# Patient Record
Sex: Male | Born: 1985 | Hispanic: Yes | Marital: Married | Smoking: Never smoker
Health system: Southern US, Community
[De-identification: ages and names within clinical notes are randomized; demographics above are authoritative.]

## PROBLEM LIST (undated history)

## (undated) DIAGNOSIS — K760 Fatty (change of) liver, not elsewhere classified: Secondary | ICD-10-CM

---

## 2017-10-09 ENCOUNTER — Encounter (HOSPITAL_COMMUNITY): Payer: Self-pay

## 2017-10-09 ENCOUNTER — Emergency Department (HOSPITAL_COMMUNITY)
Admission: EM | Admit: 2017-10-09 | Discharge: 2017-10-09 | Disposition: A | Discharge status: Home / Self Care | Payer: Self-pay | Attending: Emergency Medicine | Admitting: Emergency Medicine

## 2017-10-09 DIAGNOSIS — Z202 Contact with and (suspected) exposure to infections with a predominantly sexual mode of transmission: Secondary | ICD-10-CM | POA: Insufficient documentation

## 2017-10-09 LAB — URINALYSIS, ROUTINE W REFLEX MICROSCOPIC
Bilirubin Urine: NEGATIVE
Glucose, UA: NEGATIVE mg/dL
Hgb urine dipstick: NEGATIVE
Ketones, ur: NEGATIVE mg/dL
LEUKOCYTES UA: NEGATIVE
NITRITE: NEGATIVE
PROTEIN: NEGATIVE mg/dL
Specific Gravity, Urine: 1.021 (ref 1.005–1.030)
pH: 6 (ref 5.0–8.0)

## 2017-10-09 MED ORDER — AZITHROMYCIN 250 MG PO TABS
1000.0000 mg | ORAL_TABLET | Freq: Once | ORAL | Status: AC
  Administered 2017-10-09: 1000 mg via ORAL
  Filled 2017-10-09: qty 4

## 2017-10-09 MED ORDER — CEFTRIAXONE SODIUM 250 MG IJ SOLR
250.0000 mg | Freq: Once | INTRAMUSCULAR | Status: AC
  Administered 2017-10-09: 250 mg via INTRAMUSCULAR
  Filled 2017-10-09: qty 250

## 2017-10-09 MED ORDER — LIDOCAINE HCL (PF) 1 % IJ SOLN
INTRAMUSCULAR | Status: AC
  Filled 2017-10-09: qty 5

## 2017-10-09 NOTE — ED Provider Notes (Signed)
[facility] EMERGENCY DEPARTMENT Provider Note   CSN: [id number] Arrival date & time: 10/09/17  1116     History   Chief Complaint Chief Complaint  Patient presents with  . Exposure to STD    HPI Christ Fullenwider is a 31 y.o. male.  HPI   Patient is a 31 y.o. male with no significant past medical history presenting for possible STI exposure. Patient reports he received information that a former sexual partner had been diagnosed with an STI. Patient was unsure what STI his partner was diagnosed with. Patient is asymptomatic of abdominal pain, penile pain, testicular pain, dysuria, penile discharge. Patient reports 2 male partners in the last month. Patient is requesting testing and treatment for STI today.   History reviewed. No pertinent past medical history.  There are no active problems to display for this patient.   History reviewed. No pertinent surgical history.     Home Medications    Prior to Admission medications   Not on File    Family History No family history on file.  Social History Social History  Substance Use Topics  . Smoking status: Never Smoker  . Smokeless tobacco: Never Used  . Alcohol use No     Allergies   Patient has no allergy information on record.   Review of Systems Review of Systems  Gastrointestinal: Negative for abdominal pain.  Genitourinary: Negative for difficulty urinating, discharge, dysuria, penile pain, penile swelling, scrotal swelling and testicular pain.  Skin: Negative for rash.     Physical Exam Updated Vital Signs BP (!) 116/51 (BP Location: Right Arm)   Pulse 75   Temp 98.4 F (36.9 C) (Oral)   Resp 18   Ht 5\' 8"  (1.727 m)   SpO2 99%   Physical Exam  Constitutional: He appears well-developed and well-nourished. No distress.  Sitting comfortably in bed.  HENT:  Head: Normocephalic and atraumatic.  Eyes: Conjunctivae are normal. Right eye exhibits no discharge. Left eye exhibits  no discharge.  EOMs normal to gross examination.  Neck: Normal range of motion.  Pulmonary/Chest:  Normal respiratory effort. Patient converses comfortably. No audible wheeze or stridor.  Abdominal: He exhibits no distension.  Genitourinary:  Genitourinary Comments: Exam performed with nurse chaperone present. Uncircumcised male. No inguinal lymphadenopathy. No lesions of the penis or testicle. No pain to palpation of penis or testicles. No active penile discharge.  Musculoskeletal: Normal range of motion.  Neurological: He is alert.  Cranial nerves intact to gross observation. Patient moves extremities without difficulty.  Skin: Skin is warm and dry. He is not diaphoretic.  Psychiatric: He has a normal mood and affect. His behavior is normal. Judgment and thought content normal.  Nursing note and vitals reviewed.    ED Treatments / Results  Labs (all labs ordered are listed, but only abnormal results are displayed) Labs Reviewed  URINALYSIS, ROUTINE W REFLEX MICROSCOPIC  HIV ANTIBODY (ROUTINE TESTING)  RPR  GC/CHLAMYDIA PROBE AMP ([place]) NOT AT [facility]    EKG  EKG Interpretation None       Radiology No results found.  Procedures Procedures (including critical care time)  Medications Ordered in ED Medications  cefTRIAXone (ROCEPHIN) injection 250 mg (250 mg Intramuscular Given 10/09/17 1424)  azithromycin (ZITHROMAX) tablet 1,000 mg (1,000 mg Oral Given 10/09/17 1422)     Initial Impression / Assessment and Plan / ED Course  I have reviewed the triage vital signs and the nursing notes.  Pertinent labs & imaging results that  were available during my care of the patient were reviewed by me and considered in my medical decision making (see chart for details).     Final Clinical Impressions(s) / ED Diagnoses   Final diagnoses:  STD exposure   Patient is a 31 y.o. male who presents to ED for concerns for STI exposure. He is afebrile without abdominal  tenderness, testicular pain, or rectal pain.  No tenderness to palpation of the testes or epididymis to suggest orchitis or epididymitis.  STD cultures obtained including HIV, syphilis, gonorrhea and chlamydia for screening. Patient to be discharged with instructions to follow up with health department. Encouraged condom use. Patient understands that they have GC/Chlamydia cultures pending and that they will need to inform all sexual partners if results return positive. Engaged in shared decision-making with patient regarding prophylactic treatment, and possibly delaying treatment until GC/CT tests result. Patient wished to be treated today. Patient has been treated prophylactically with azithromycin and Rocephin. Return precautions given and all questions answered.   New Prescriptions There are no discharge medications for this patient.    Elisha PonderMurray, Kensington Rios B, PA-C 10/09/17 2024    Pricilla LovelessGoldston, Scott, MD 10/10/17 [phone]

## 2017-10-09 NOTE — Discharge Instructions (Signed)
Medications  1. Azithromycin  2. Ceftriaxone  Use a condom with every sexual encounter Follow up with your doctor or the health department concerning today's visit. Please return to the ER for worsening symptoms, high fevers or persistent vomiting.  You have been tested for HIV, syphilis, chlamydia and gonorrhea. These results will be available in approximately 3 days. You will be notified if they are positive.   It is very important to practice safe sex and use condoms when sexually active. If your results are positive you need to notify all sexual partners so they can be treated as well. The website https://garcia.net/http://www.dontspreadit.com/ can be used to send anonymous text messages or emails to alert sexual contacts.   SEEK IMMEDIATE MEDICAL CARE IF:  You develop an oral temperature above 102 F (38.9 C), not controlled by medications or lasting more than 2 days.  You develop an increase in pain.  You develop painful intercourse.

## 2017-10-09 NOTE — ED Triage Notes (Signed)
Pt arrives to ed requesting STD test after hearing someone he had sex with had an std. Denies any discharge/ dysuria/ bumps.

## 2017-10-10 LAB — RPR: RPR Ser Ql: NONREACTIVE

## 2017-10-10 LAB — GC/CHLAMYDIA PROBE AMP (~~LOC~~) NOT AT ARMC
CHLAMYDIA, DNA PROBE: NEGATIVE
Neisseria Gonorrhea: NEGATIVE

## 2017-10-10 LAB — HIV ANTIBODY (ROUTINE TESTING W REFLEX): HIV Screen 4th Generation wRfx: NONREACTIVE

## 2017-12-04 ENCOUNTER — Emergency Department (HOSPITAL_COMMUNITY)
Admission: EM | Admit: 2017-12-04 | Discharge: 2017-12-05 | Disposition: A | Discharge status: Home / Self Care | Payer: Self-pay | Attending: Emergency Medicine | Admitting: Emergency Medicine

## 2017-12-04 ENCOUNTER — Other Ambulatory Visit: Payer: Self-pay

## 2017-12-04 ENCOUNTER — Encounter (HOSPITAL_COMMUNITY): Payer: Self-pay | Admitting: Emergency Medicine

## 2017-12-04 ENCOUNTER — Emergency Department (HOSPITAL_COMMUNITY): Payer: Self-pay

## 2017-12-04 DIAGNOSIS — R112 Nausea with vomiting, unspecified: Secondary | ICD-10-CM | POA: Insufficient documentation

## 2017-12-04 DIAGNOSIS — R1013 Epigastric pain: Secondary | ICD-10-CM | POA: Insufficient documentation

## 2017-12-04 DIAGNOSIS — R1011 Right upper quadrant pain: Secondary | ICD-10-CM | POA: Insufficient documentation

## 2017-12-04 DIAGNOSIS — R197 Diarrhea, unspecified: Secondary | ICD-10-CM | POA: Insufficient documentation

## 2017-12-04 HISTORY — DX: Fatty (change of) liver, not elsewhere classified: K76.0

## 2017-12-04 LAB — LIPASE, BLOOD: Lipase: 37 U/L (ref 11–51)

## 2017-12-04 LAB — COMPREHENSIVE METABOLIC PANEL
ALBUMIN: 4.1 g/dL (ref 3.5–5.0)
ALK PHOS: 78 U/L (ref 38–126)
ALT: 48 U/L (ref 17–63)
AST: 34 U/L (ref 15–41)
Anion gap: 6 (ref 5–15)
BUN: 14 mg/dL (ref 6–20)
CALCIUM: 8.9 mg/dL (ref 8.9–10.3)
CO2: 27 mmol/L (ref 22–32)
CREATININE: 0.8 mg/dL (ref 0.61–1.24)
Chloride: 104 mmol/L (ref 101–111)
GFR calc Af Amer: >60 mL/min (ref >60)
GFR calc non Af Amer: >60 mL/min (ref >60)
GLUCOSE: 97 mg/dL (ref 65–99)
Potassium: 4.4 mmol/L (ref 3.5–5.1)
SODIUM: 137 mmol/L (ref 135–145)
Total Bilirubin: 1.2 mg/dL (ref 0.3–1.2)
Total Protein: 6.8 g/dL (ref 6.5–8.1)

## 2017-12-04 LAB — URINALYSIS, ROUTINE W REFLEX MICROSCOPIC
Bilirubin Urine: NEGATIVE
Glucose, UA: NEGATIVE mg/dL
Hgb urine dipstick: NEGATIVE
Ketones, ur: NEGATIVE mg/dL
Leukocytes, UA: NEGATIVE
Nitrite: NEGATIVE
Protein, ur: NEGATIVE mg/dL
Specific Gravity, Urine: 1.028 (ref 1.005–1.030)
pH: 6 (ref 5.0–8.0)

## 2017-12-04 LAB — CBC
HCT: 44.4 % (ref 39.0–52.0)
Hemoglobin: 15 g/dL (ref 13.0–17.0)
MCH: 29.6 pg (ref 26.0–34.0)
MCHC: 33.8 g/dL (ref 30.0–36.0)
MCV: 87.7 fL (ref 78.0–100.0)
PLATELETS: 262 10*3/uL (ref 150–400)
RBC: 5.06 MIL/uL (ref 4.22–5.81)
RDW: 12.9 % (ref 11.5–15.5)
WBC: 7 10*3/uL (ref 4.0–10.5)

## 2017-12-04 MED ORDER — SUCRALFATE 1 G PO TABS
1.0000 g | ORAL_TABLET | Freq: Once | ORAL | Status: AC
  Administered 2017-12-04: 1 g via ORAL
  Filled 2017-12-04: qty 1

## 2017-12-04 MED ORDER — FAMOTIDINE 20 MG PO TABS
40.0000 mg | ORAL_TABLET | Freq: Once | ORAL | Status: AC
  Administered 2017-12-04: 40 mg via ORAL
  Filled 2017-12-04: qty 2

## 2017-12-04 NOTE — ED Notes (Signed)
Patient transported to Ultrasound 

## 2017-12-04 NOTE — ED Triage Notes (Addendum)
To ED with c/o right lower quad pain, nausea, and diarrhea, has hx fatty liver --- dx 8 months ago-- quit drinking at that time. Has had a drink in November, unable to keep anything down -- ate soup tonight and vomited.

## 2017-12-04 NOTE — ED Provider Notes (Signed)
[facility] Provider Note   CSN: [id number] Arrival date & time: 12/04/17  1702     History   Chief Complaint Chief Complaint  Patient presents with  . Abdominal Pain  . Emesis  . Diarrhea    HPI Henry Choi is a 31 y.o. male.  The history is provided by the patient and medical records.  Abdominal Pain   Associated symptoms include nausea and vomiting.  Emesis   Associated symptoms include abdominal pain.  Diarrhea   Associated symptoms include abdominal pain and vomiting.     31 year old male with history of fatty liver, presenting to the ED with abdominal pain.  Reports this began on Saturday evening.  Reports it improves somewhat on Sunday but got worse again last night.  Reports pain localized to epigastrium and right upper quadrant described as a "burning" sensation.  He has not noticed that pain is worse with eating.  He has had some associated nausea and vomiting.  No fever or chills. BM's normal. No prior abdominal surgeries.  No meds tried PTA.  Past Medical History:  Diagnosis Date  . Fatty liver     There are no active problems to display for this patient.   History reviewed. No pertinent surgical history.     Home Medications    Prior to Admission medications   Not on File    Family History No family history on file.  Social History Social History   Tobacco Use  . Smoking status: Never Smoker  . Smokeless tobacco: Never Used  Substance Use Topics  . Alcohol use: No  . Drug use: No     Allergies   Patient has no known allergies.   Review of Systems Review of Systems  Gastrointestinal: Positive for abdominal pain, nausea and vomiting.  All other systems reviewed and are negative.    Physical Exam Updated Vital Signs BP (!) 132/49 (BP Location: Right Arm)   Pulse 68   Temp 98.7 F (37.1 C) (Oral)   Resp 16   SpO2 100%   Physical Exam  Constitutional: He is oriented to person,  place, and time. He appears well-developed and well-nourished.  HENT:  Head: Normocephalic and atraumatic.  Mouth/Throat: Oropharynx is clear and moist.  Eyes: Conjunctivae and EOM are normal. Pupils are equal, round, and reactive to light.  Neck: Normal range of motion.  Cardiovascular: Normal rate, regular rhythm and normal heart sounds.  Pulmonary/Chest: Effort normal and breath sounds normal.  Abdominal: Soft. Bowel sounds are normal. There is no tenderness. There is no rigidity and no guarding.  No focal tenderness on exam, endorses burning pain in RUQ and epigastrium  Musculoskeletal: Normal range of motion.  Neurological: He is alert and oriented to person, place, and time.  Skin: Skin is warm and dry.  Psychiatric: He has a normal mood and affect.  Nursing note and vitals reviewed.    ED Treatments / Results  Labs (all labs ordered are listed, but only abnormal results are displayed) Labs Reviewed  LIPASE, BLOOD  COMPREHENSIVE METABOLIC PANEL  CBC  URINALYSIS, ROUTINE W REFLEX MICROSCOPIC    EKG  EKG Interpretation None       Radiology [place] Abdomen Limited Ruq  Result Date: 12/04/2017 CLINICAL DATA:  Right upper quadrant pain. EXAM: ULTRASOUND ABDOMEN LIMITED RIGHT UPPER QUADRANT COMPARISON:  None. FINDINGS: Gallbladder: Physiologically distended. No gallstones or wall thickening visualized. No sonographic Murphy sign noted by sonographer. Common bile duct: Diameter: 2 mm, normal. Liver:  Appears prominent size. Diffusely increased and heterogeneous parenchymal echogenicity. Liver parenchyma is difficult to penetrate. No discrete focal lesion. Portal vein is patent on color Doppler imaging with normal direction of blood flow towards the liver. IMPRESSION: 1. Prominent size liver with hepatic steatosis. 2. Normal sonographic appearance of the gallbladder and biliary tree. Electronically Signed   By: Rubye OaksMelanie  Ehinger M.D.   On: 12/04/2017 23:35    Procedures Procedures  (including critical care time)  Medications Ordered in ED Medications  famotidine (PEPCID) tablet 40 mg (40 mg Oral Given 12/04/17 2305)  sucralfate (CARAFATE) tablet 1 g (1 g Oral Given 12/04/17 2305)     Initial Impression / Assessment and Plan / ED Course  I have reviewed the triage vital signs and the nursing notes.  Pertinent labs & imaging results that were available during my care of the patient were reviewed by me and considered in my medical decision making (see chart for details).  31 year old male presenting to the ED with epigastric pain, ongoing for a few days now.  Describes as burning in nature.  Not necessarily associated with food.  Has history of fatty liver but no other significant abdominal history.  He is afebrile and nontoxic in appearance.  Abdomen is soft and nontender.  No distention.  Continues to endorse burning sensation in epigastrium and right upper quadrant.  No history of gallstones.  Screening lab work from triage is overall reassuring.  Will obtain ultrasound.  Given Pepcid and Carafate here.  Patient feeling much better after oral medications here.  He has not had any recurrent nausea or vomiting, tolerating fluids well.  Ultrasound with findings of fatty liver which are known, no abnormal findings of the gallbladder or biliary tree.  At this time, suspect patient may have some gastritis.  Will discharge home with continued treatment for such.  Discussed diet modification.  Close follow-up with PCP.  Discussed plan with patient, he acknowledged understanding and agreed with plan of care.  Return precautions given for new or worsening symptoms.  Final Clinical Impressions(s) / ED Diagnoses   Final diagnoses:  RUQ pain  Epigastric abdominal pain  Non-intractable vomiting with nausea, unspecified vomiting type    ED Discharge Orders        Ordered    sucralfate (CARAFATE) 1 g tablet  3 times daily with meals & bedtime     12/05/17 0023    famotidine  (PEPCID) 20 MG tablet  2 times daily     12/05/17 0023    ondansetron (ZOFRAN ODT) 4 MG disintegrating tablet  Every 8 hours PRN     12/05/17 0023       Garlon HatchetSanders, Kamin Niblack M, PA-C 12/05/17 0034    Lavera GuiseLiu, Dana Duo, MD 12/05/17 1349

## 2017-12-05 MED ORDER — SUCRALFATE 1 G PO TABS: 1.0000 g | ORAL_TABLET | Freq: Three times a day (TID) | ORAL | 0 refills | Status: AC

## 2017-12-05 MED ORDER — FAMOTIDINE 20 MG PO TABS: 20.0000 mg | ORAL_TABLET | Freq: Two times a day (BID) | ORAL | 0 refills | Status: AC

## 2017-12-05 MED ORDER — ONDANSETRON 4 MG PO TBDP: 4.0000 mg | ORAL_TABLET | Freq: Three times a day (TID) | ORAL | 0 refills | Status: AC | PRN

## 2017-12-05 NOTE — Discharge Instructions (Signed)
Take the prescribed medication as directed.  Try to avoid spicy/acidic food until symptoms improve more as this can make pain/nausea worse. Follow-up with your primary care doctor. Return to the ED for new or worsening symptoms.

## 2017-12-05 NOTE — ED Notes (Signed)
Pt departed in NAD.  

## 2017-12-05 NOTE — ED Notes (Signed)
ED Provider at bedside. 

## 2018-02-28 ENCOUNTER — Emergency Department (HOSPITAL_COMMUNITY)
Admission: EM | Admit: 2018-02-28 | Discharge: 2018-02-28 | Disposition: A | Discharge status: Home / Self Care | Payer: Self-pay | Attending: Emergency Medicine | Admitting: Emergency Medicine

## 2018-02-28 ENCOUNTER — Encounter (HOSPITAL_COMMUNITY): Payer: Self-pay | Admitting: Emergency Medicine

## 2018-02-28 DIAGNOSIS — K047 Periapical abscess without sinus: Secondary | ICD-10-CM | POA: Insufficient documentation

## 2018-02-28 DIAGNOSIS — K029 Dental caries, unspecified: Secondary | ICD-10-CM

## 2018-02-28 DIAGNOSIS — Z79899 Other long term (current) drug therapy: Secondary | ICD-10-CM | POA: Insufficient documentation

## 2018-02-28 MED ORDER — IBUPROFEN 400 MG PO TABS
400.0000 mg | ORAL_TABLET | Freq: Once | ORAL | Status: AC | PRN
  Administered 2018-02-28: 400 mg via ORAL
  Filled 2018-02-28: qty 1

## 2018-02-28 MED ORDER — PENICILLIN V POTASSIUM 500 MG PO TABS: 500.0000 mg | ORAL_TABLET | Freq: Four times a day (QID) | ORAL | 0 refills | Status: AC

## 2018-02-28 NOTE — Discharge Instructions (Signed)
Call Dr. Olen PelHalligan's office tomorrow for follow up.

## 2018-02-28 NOTE — ED Provider Notes (Signed)
[facility] EMERGENCY DEPARTMENT Provider Note   CSN: [id number] Arrival date & time: 02/28/18  1741     History   Chief Complaint Chief Complaint  Patient presents with  . Dental Pain    HPI Henry Choi is a 32 y.o. male who presents to the ED with dental pain that started this morning. The pain is located in the right lower dental area. Patient reports having a cavity. He does not currently have a dentist.   HPI  Past Medical History:  Diagnosis Date  . Fatty liver     There are no active problems to display for this patient.   No past surgical history on file.     Home Medications    Prior to Admission medications   Medication Sig Start Date End Date Taking? Authorizing Provider  famotidine (PEPCID) 20 MG tablet Take 1 tablet (20 mg total) by mouth 2 (two) times daily. 12/05/17   Garlon Hatchet, PA-C  ondansetron (ZOFRAN ODT) 4 MG disintegrating tablet Take 1 tablet (4 mg total) by mouth every 8 (eight) hours as needed for nausea. 12/05/17   Garlon Hatchet, PA-C  penicillin v potassium (VEETID) 500 MG tablet Take 1 tablet (500 mg total) by mouth 4 (four) times daily for 10 days. 02/28/18 03/10/18  Janne Napoleon, NP  sucralfate (CARAFATE) 1 g tablet Take 1 tablet (1 g total) by mouth 4 (four) times daily -  with meals and at bedtime. 12/05/17   Garlon Hatchet, PA-C    Family History No family history on file.  Social History Social History   Tobacco Use  . Smoking status: Never Smoker  . Smokeless tobacco: Never Used  Substance Use Topics  . Alcohol use: No  . Drug use: No     Allergies   Patient has no known allergies.   Review of Systems Review of Systems  Constitutional: Negative for fever.  HENT: Positive for dental problem.   Respiratory: Negative for cough.   Cardiovascular: Negative for chest pain.  Gastrointestinal: Negative for abdominal pain.  Musculoskeletal: Negative for neck pain.  Neurological: Negative for  headaches.  Hematological: Negative for adenopathy.  Psychiatric/Behavioral: Negative for confusion.     Physical Exam Updated Vital Signs BP 121/71   Pulse 75   Temp 97.8 F (36.6 C)   Resp 16   SpO2 98%   Physical Exam  Constitutional: He appears well-developed and well-nourished. No distress.  HENT:  Head: Normocephalic.  Mouth/Throat: Oropharynx is clear and moist and mucous membranes are normal.    Tenderness and decay right lower first molar.   Eyes: EOM are normal.  Neck: Neck supple.  Cardiovascular: Normal rate.  Pulmonary/Chest: Effort normal.  Abdominal: Soft. There is no tenderness.  Musculoskeletal: Normal range of motion.  Lymphadenopathy:    He has no cervical adenopathy.  Neurological: He is alert.  Skin: Skin is warm and dry.  Psychiatric: He has a normal mood and affect.  Nursing note and vitals reviewed.    ED Treatments / Results  Labs (all labs ordered are listed, but only abnormal results are displayed) Labs Reviewed - No data to display  Radiology No results found.  Procedures Procedures (including critical care time)  Medications Ordered in ED Medications  ibuprofen (ADVIL,MOTRIN) tablet 400 mg (400 mg Oral Given 02/28/18 1837)     Initial Impression / Assessment and Plan / ED Course  I have reviewed the triage vital signs and the nursing notes. Patient with  toothache.  No gross abscess.  Exam unconcerning for Ludwig's angina or spread of infection.  Will treat with penicillin and anti-inflammatories medicine.  Urged patient to follow-up with dentist.    Final Clinical Impressions(s) / ED Diagnoses   Final diagnoses:  Infected dental caries    ED Discharge Orders        Ordered    penicillin v potassium (VEETID) 500 MG tablet  4 times daily     02/28/18 1837       Kerrie Buffaloeese, Anaelle Dunton SekiuM, NP 02/28/18 1846    Tilden Fossaees, Elizabeth, MD 03/02/18 1440

## 2018-02-28 NOTE — ED Triage Notes (Signed)
Pt states new onset right lower dental pain starting this morning. Denies injury

## 2018-05-03 IMAGING — US US ABDOMEN LIMITED
1 series · 14 of 25 positions shown · non-contrast
Comparison: None.

CLINICAL DATA: Right upper quadrant pain.

EXAM:
ULTRASOUND ABDOMEN LIMITED RIGHT UPPER QUADRANT

[Series 1: us abdomen limited · 0.30mm/px · 14 of 27 slices shown]
[im 1/27]
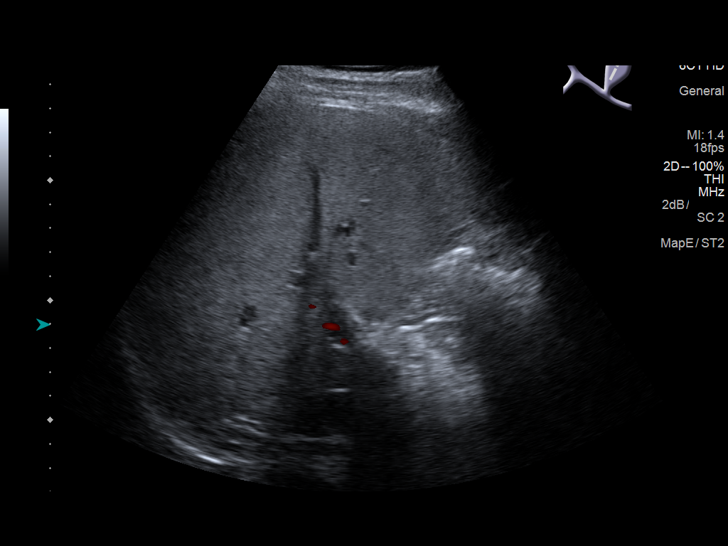
[im 3/27]
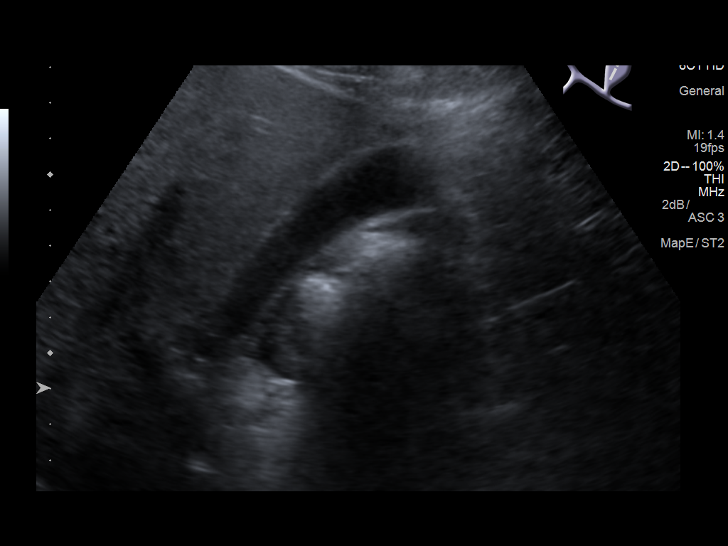
[im 5/27]
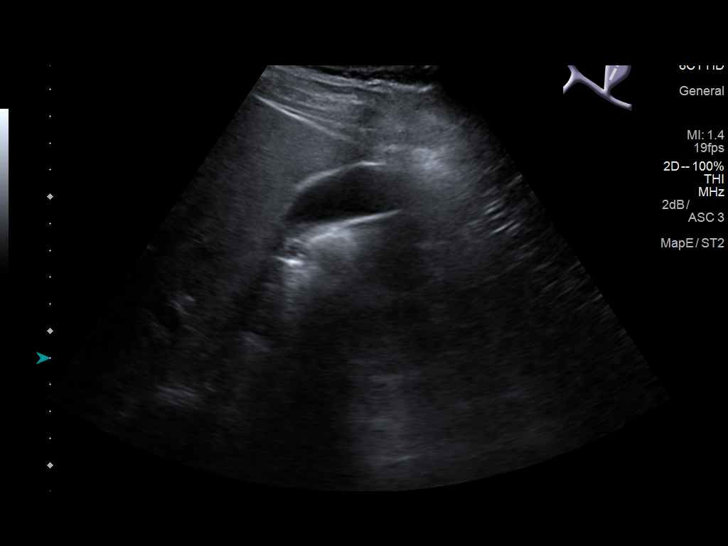
[im 7/27]
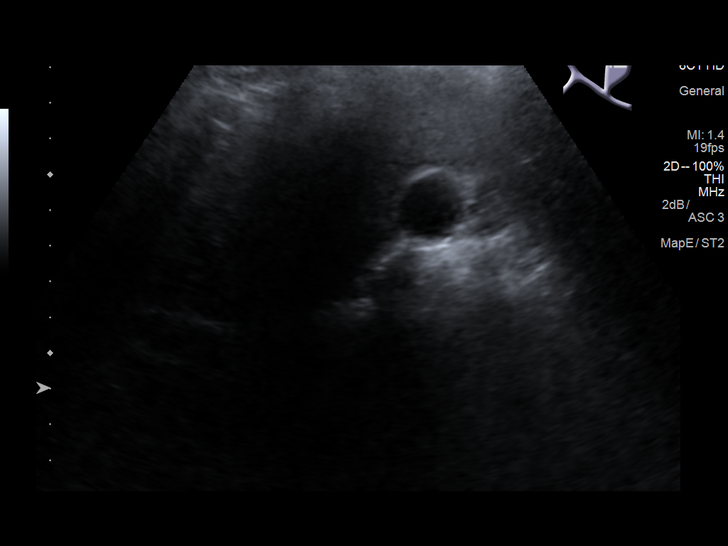
[im 9/27]
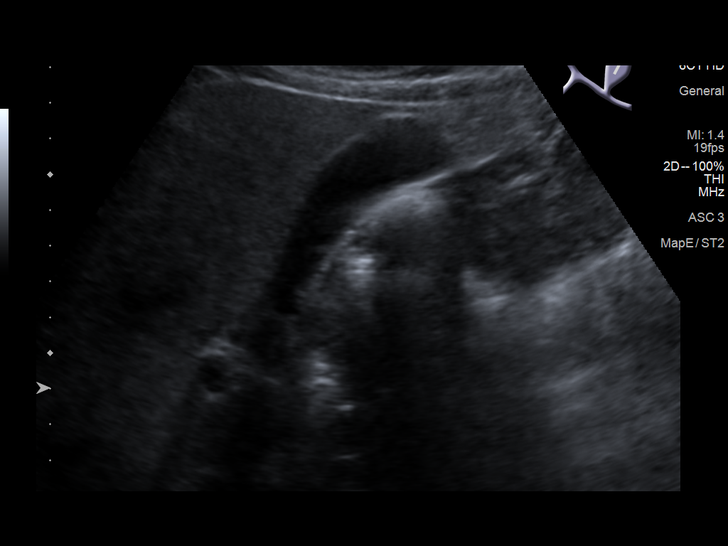
[im 10/27]
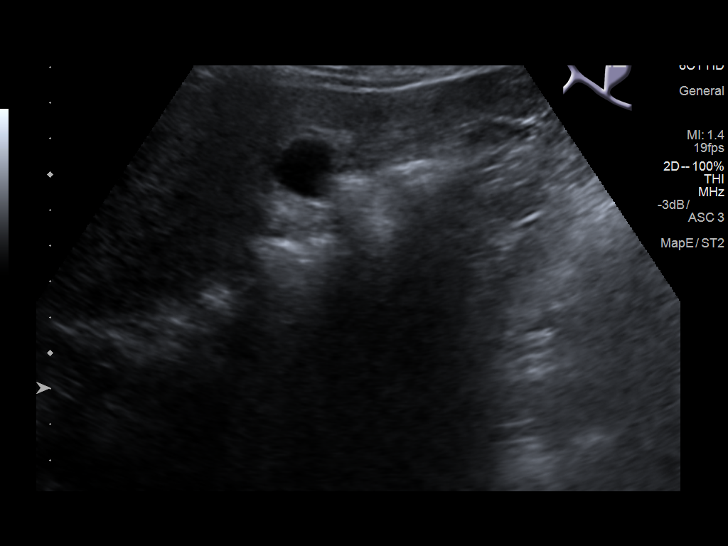
[im 12/27]
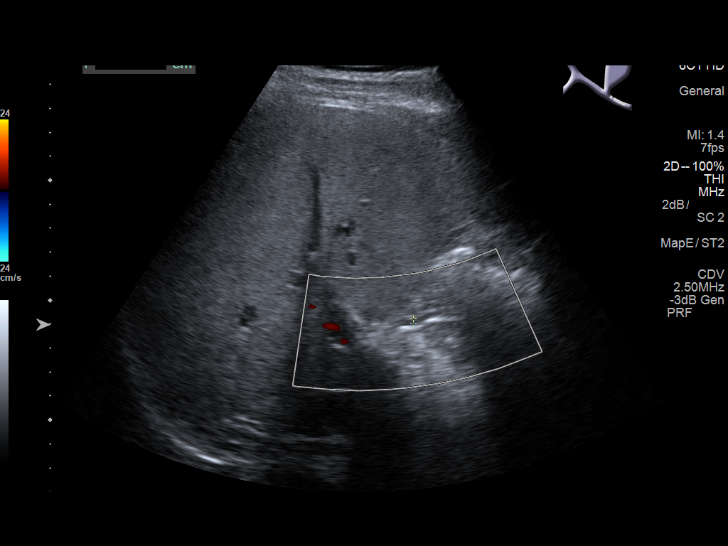
[im 15/27]
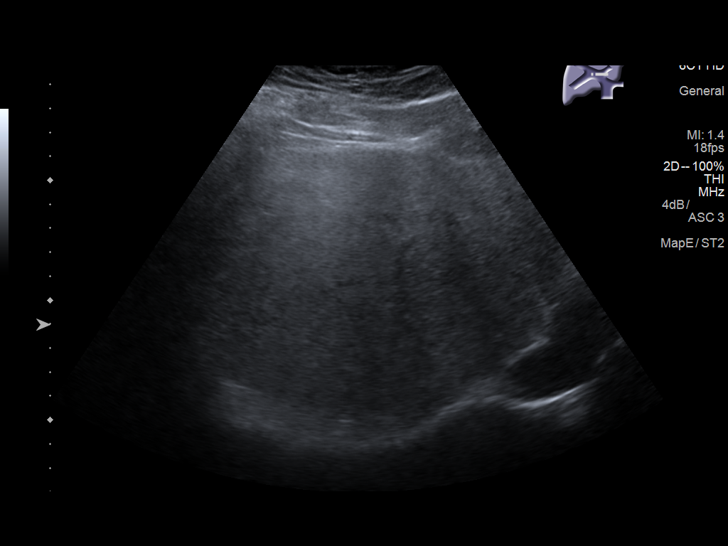
[im 17/27]
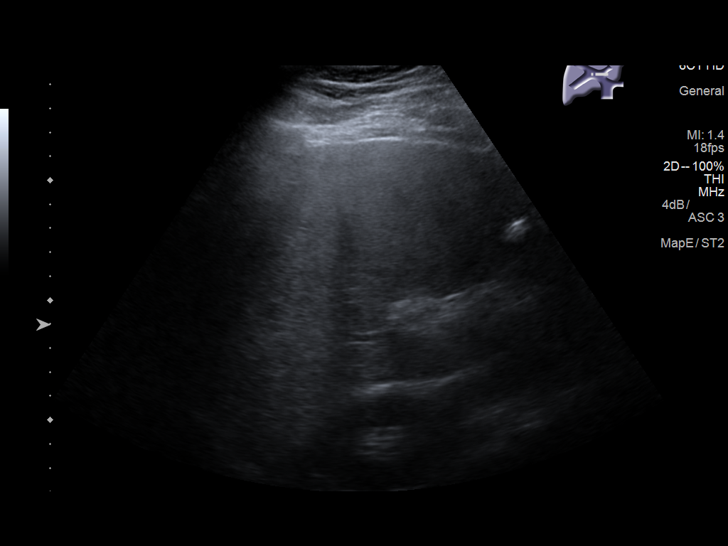
[im 18/27]
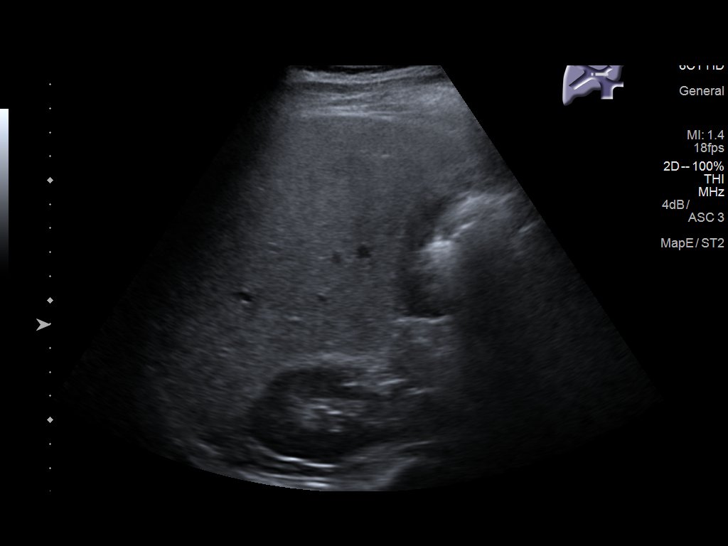
[im 20/27]
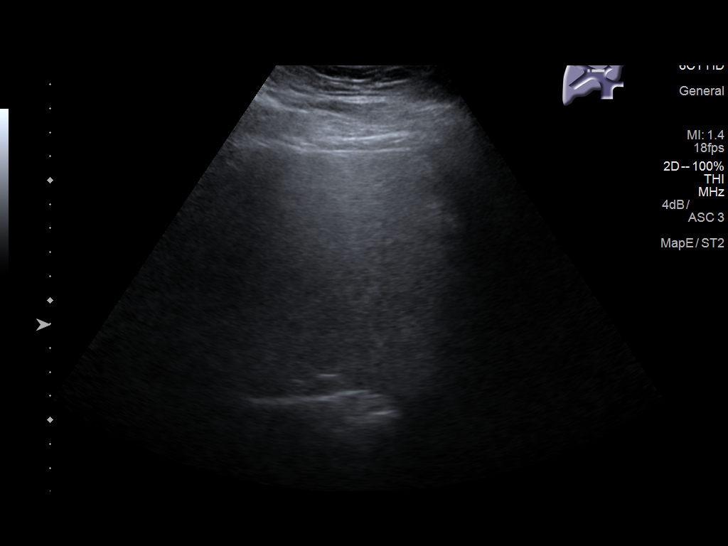
[im 22/27]
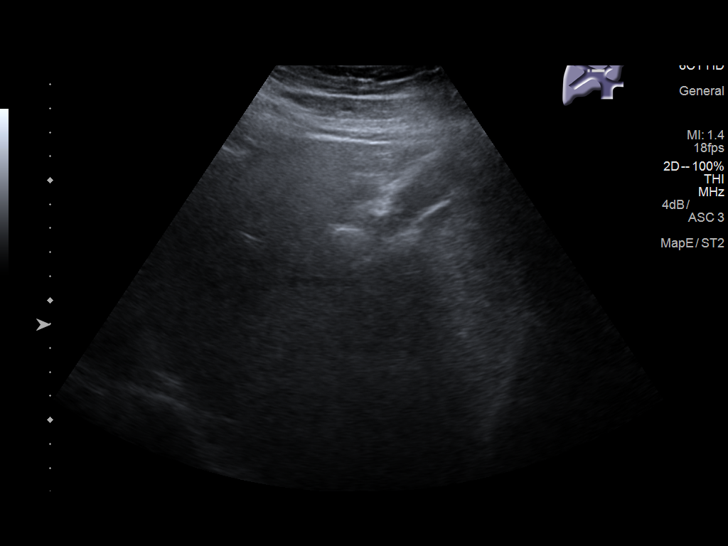
[im 24/27]
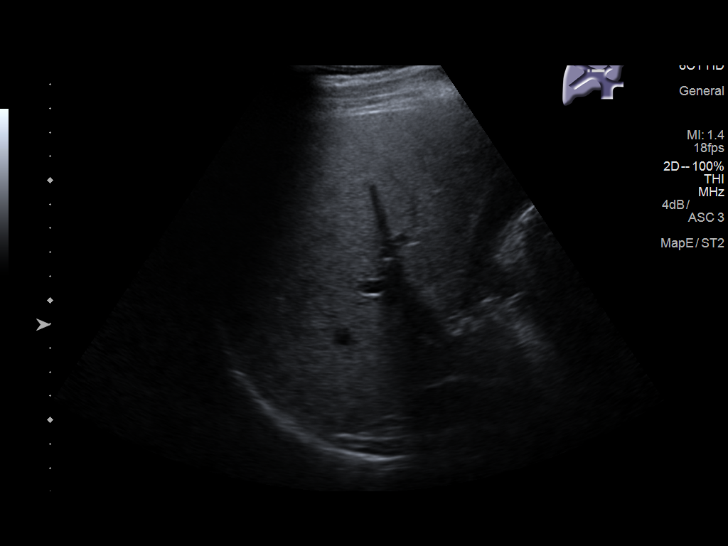
[im 27/27]
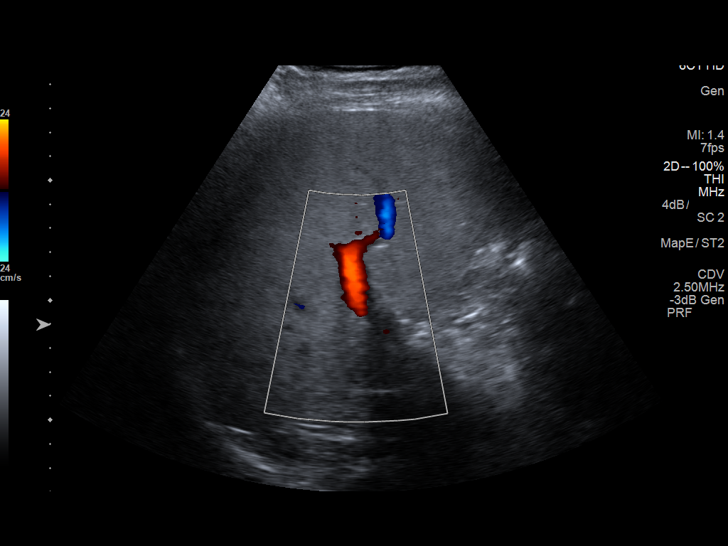

[14 of 25 positions shown; findings below may reference images not displayed]

FINDINGS: Gallbladder:

Physiologically distended. No gallstones or wall thickening
visualized. No sonographic Murphy sign noted by sonographer.

Common bile duct:

Diameter: 2 mm, normal.

Liver:

Appears prominent size. Diffusely increased and heterogeneous
parenchymal echogenicity. Liver parenchyma is difficult to
penetrate. No discrete focal lesion. Portal vein is patent on color
Doppler imaging with normal direction of blood flow towards the
liver.
IMPRESSION: 1. Prominent size liver with hepatic steatosis.
2. Normal sonographic appearance of the gallbladder and biliary
tree.
